# Patient Record
Sex: Male | Born: 2000 | Hispanic: No | Marital: Single | Smoking: Never smoker
Health system: Southern US, Community
[De-identification: ages and names within clinical notes are randomized; demographics above are authoritative.]

---
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
## 2020-11-14 ENCOUNTER — Ambulatory Visit: Payer: Medicaid Other | Admitting: Pediatrics

## 2021-03-01 DIAGNOSIS — Z23 Encounter for immunization: Secondary | ICD-10-CM | POA: Diagnosis not present

## 2021-03-01 DIAGNOSIS — Z0000 Encounter for general adult medical examination without abnormal findings: Secondary | ICD-10-CM | POA: Diagnosis not present

## 2021-03-01 DIAGNOSIS — R41840 Attention and concentration deficit: Secondary | ICD-10-CM | POA: Diagnosis not present

## 2021-04-25 DIAGNOSIS — Z1159 Encounter for screening for other viral diseases: Secondary | ICD-10-CM | POA: Diagnosis not present

## 2021-04-25 DIAGNOSIS — F32A Depression, unspecified: Secondary | ICD-10-CM | POA: Diagnosis not present

## 2021-04-25 DIAGNOSIS — R5383 Other fatigue: Secondary | ICD-10-CM | POA: Diagnosis not present

## 2021-04-25 DIAGNOSIS — F411 Generalized anxiety disorder: Secondary | ICD-10-CM | POA: Diagnosis not present

## 2021-04-25 DIAGNOSIS — R41840 Attention and concentration deficit: Secondary | ICD-10-CM | POA: Diagnosis not present

## 2021-06-10 DIAGNOSIS — Z23 Encounter for immunization: Secondary | ICD-10-CM | POA: Diagnosis not present

## 2021-06-10 DIAGNOSIS — F32A Depression, unspecified: Secondary | ICD-10-CM | POA: Diagnosis not present

## 2021-06-10 DIAGNOSIS — F411 Generalized anxiety disorder: Secondary | ICD-10-CM | POA: Diagnosis not present

## 2021-06-10 DIAGNOSIS — R41840 Attention and concentration deficit: Secondary | ICD-10-CM | POA: Diagnosis not present

## 2021-11-25 DIAGNOSIS — F32A Depression, unspecified: Secondary | ICD-10-CM | POA: Diagnosis not present

## 2021-11-25 DIAGNOSIS — F419 Anxiety disorder, unspecified: Secondary | ICD-10-CM | POA: Diagnosis not present

## 2021-11-25 DIAGNOSIS — R41840 Attention and concentration deficit: Secondary | ICD-10-CM | POA: Diagnosis not present

## 2022-08-26 DIAGNOSIS — B351 Tinea unguium: Secondary | ICD-10-CM | POA: Diagnosis not present

## 2022-08-26 DIAGNOSIS — F411 Generalized anxiety disorder: Secondary | ICD-10-CM | POA: Diagnosis not present

## 2022-08-26 DIAGNOSIS — Z5181 Encounter for therapeutic drug level monitoring: Secondary | ICD-10-CM | POA: Diagnosis not present

## 2022-09-08 ENCOUNTER — Telehealth: Payer: Self-pay

## 2022-09-08 NOTE — Telephone Encounter (Signed)
Spoke with patient who declined to schedule appointment. AS< CMA

## 2023-01-12 DIAGNOSIS — K0889 Other specified disorders of teeth and supporting structures: Secondary | ICD-10-CM | POA: Diagnosis not present

## 2023-01-14 DIAGNOSIS — H52209 Unspecified astigmatism, unspecified eye: Secondary | ICD-10-CM | POA: Diagnosis not present

## 2023-01-14 DIAGNOSIS — K029 Dental caries, unspecified: Secondary | ICD-10-CM | POA: Diagnosis not present

## 2023-06-05 DIAGNOSIS — Z23 Encounter for immunization: Secondary | ICD-10-CM | POA: Diagnosis not present

## 2023-06-05 DIAGNOSIS — Z0000 Encounter for general adult medical examination without abnormal findings: Secondary | ICD-10-CM | POA: Diagnosis not present

## 2023-06-12 DIAGNOSIS — F419 Anxiety disorder, unspecified: Secondary | ICD-10-CM | POA: Diagnosis not present

## 2023-06-12 DIAGNOSIS — F341 Dysthymic disorder: Secondary | ICD-10-CM | POA: Diagnosis not present

## 2023-06-19 DIAGNOSIS — F419 Anxiety disorder, unspecified: Secondary | ICD-10-CM | POA: Diagnosis not present

## 2023-06-19 DIAGNOSIS — F341 Dysthymic disorder: Secondary | ICD-10-CM | POA: Diagnosis not present

## 2023-06-26 DIAGNOSIS — F341 Dysthymic disorder: Secondary | ICD-10-CM | POA: Diagnosis not present

## 2023-06-26 DIAGNOSIS — F419 Anxiety disorder, unspecified: Secondary | ICD-10-CM | POA: Diagnosis not present

## 2023-07-10 DIAGNOSIS — F411 Generalized anxiety disorder: Secondary | ICD-10-CM | POA: Diagnosis not present

## 2023-07-10 DIAGNOSIS — F341 Dysthymic disorder: Secondary | ICD-10-CM | POA: Diagnosis not present
